# Patient Record
Sex: Female | Born: 1985 | Race: Black or African American | Hispanic: No | Marital: Single | Smoking: Current every day smoker
Health system: Southern US, Community
[De-identification: ages and names within clinical notes are randomized; demographics above are authoritative.]

## PROBLEM LIST (undated history)

## (undated) ENCOUNTER — Emergency Department (HOSPITAL_COMMUNITY): Admission: EM | Disposition: A | Discharge status: Home / Self Care | Payer: Self-pay

---

## 2008-06-28 ENCOUNTER — Inpatient Hospital Stay (HOSPITAL_COMMUNITY): Admission: AD | Admit: 2008-06-28 | Discharge: 2008-06-28 | Payer: Self-pay | Admitting: Obstetrics & Gynecology

## 2008-07-07 ENCOUNTER — Inpatient Hospital Stay (HOSPITAL_COMMUNITY): Admission: AD | Admit: 2008-07-07 | Discharge: 2008-07-08 | Payer: Self-pay | Admitting: Obstetrics & Gynecology

## 2008-08-26 ENCOUNTER — Inpatient Hospital Stay (HOSPITAL_COMMUNITY): Admission: AD | Admit: 2008-08-26 | Discharge: 2008-08-26 | Payer: Self-pay | Admitting: Obstetrics

## 2008-09-01 ENCOUNTER — Ambulatory Visit (HOSPITAL_COMMUNITY): Admission: RE | Admit: 2008-09-01 | Discharge: 2008-09-01 | Payer: Self-pay | Admitting: Obstetrics

## 2008-10-16 ENCOUNTER — Inpatient Hospital Stay (HOSPITAL_COMMUNITY): Admission: AD | Admit: 2008-10-16 | Discharge: 2008-10-16 | Payer: Self-pay | Admitting: Obstetrics

## 2008-12-21 ENCOUNTER — Inpatient Hospital Stay (HOSPITAL_COMMUNITY): Admission: AD | Admit: 2008-12-21 | Discharge: 2008-12-26 | Payer: Self-pay | Admitting: Obstetrics

## 2008-12-28 ENCOUNTER — Inpatient Hospital Stay (HOSPITAL_COMMUNITY): Admission: AD | Admit: 2008-12-28 | Discharge: 2008-12-28 | Payer: Self-pay | Admitting: Obstetrics

## 2011-02-19 LAB — CBC
HCT: 24 % — ABNORMAL LOW (ref 36.0–46.0)
HCT: 30.1 % — ABNORMAL LOW (ref 36.0–46.0)
Hemoglobin: 10.1 g/dL — ABNORMAL LOW (ref 12.0–15.0)
Hemoglobin: 10.4 g/dL — ABNORMAL LOW (ref 12.0–15.0)
Hemoglobin: 8 g/dL — ABNORMAL LOW (ref 12.0–15.0)
MCHC: 33.5 g/dL (ref 30.0–36.0)
MCHC: 34.3 g/dL (ref 30.0–36.0)
MCV: 87.9 fL (ref 78.0–100.0)
MCV: 89.1 fL (ref 78.0–100.0)
MCV: 90 fL (ref 78.0–100.0)
Platelets: 259 10*3/uL (ref 150–400)
Platelets: 270 10*3/uL (ref 150–400)
Platelets: 292 10*3/uL (ref 150–400)
RBC: 2.66 MIL/uL — ABNORMAL LOW (ref 3.87–5.11)
RBC: 3.35 MIL/uL — ABNORMAL LOW (ref 3.87–5.11)
RBC: 3.43 MIL/uL — ABNORMAL LOW (ref 3.87–5.11)
RBC: 3.67 MIL/uL — ABNORMAL LOW (ref 3.87–5.11)
WBC: 11 10*3/uL — ABNORMAL HIGH (ref 4.0–10.5)
WBC: 16.5 10*3/uL — ABNORMAL HIGH (ref 4.0–10.5)
WBC: 8.9 10*3/uL (ref 4.0–10.5)
WBC: 9.5 10*3/uL (ref 4.0–10.5)

## 2011-02-19 LAB — COMPREHENSIVE METABOLIC PANEL
ALT: 112 U/L — ABNORMAL HIGH (ref 0–35)
ALT: 193 U/L — ABNORMAL HIGH (ref 0–35)
ALT: 203 U/L — ABNORMAL HIGH (ref 0–35)
ALT: 87 U/L — ABNORMAL HIGH (ref 0–35)
AST: 134 U/L — ABNORMAL HIGH (ref 0–37)
AST: 169 U/L — ABNORMAL HIGH (ref 0–37)
AST: 58 U/L — ABNORMAL HIGH (ref 0–37)
Albumin: 2.1 g/dL — ABNORMAL LOW (ref 3.5–5.2)
Alkaline Phosphatase: 178 U/L — ABNORMAL HIGH (ref 39–117)
Alkaline Phosphatase: 242 U/L — ABNORMAL HIGH (ref 39–117)
Alkaline Phosphatase: 260 U/L — ABNORMAL HIGH (ref 39–117)
Alkaline Phosphatase: 277 U/L — ABNORMAL HIGH (ref 39–117)
BUN: 2 mg/dL — ABNORMAL LOW (ref 6–23)
BUN: <1 mg/dL — ABNORMAL LOW (ref 6–23)
CO2: 25 mEq/L (ref 19–32)
CO2: 27 mEq/L (ref 19–32)
Calcium: 7 mg/dL — ABNORMAL LOW (ref 8.4–10.5)
Calcium: 7.5 mg/dL — ABNORMAL LOW (ref 8.4–10.5)
Calcium: 8.5 mg/dL (ref 8.4–10.5)
Chloride: 101 mEq/L (ref 96–112)
Chloride: 103 mEq/L (ref 96–112)
Chloride: 106 mEq/L (ref 96–112)
GFR calc Af Amer: >60 mL/min (ref >60)
GFR calc Af Amer: >60 mL/min (ref >60)
GFR calc Af Amer: >60 mL/min (ref >60)
GFR calc non Af Amer: >60 mL/min (ref >60)
GFR calc non Af Amer: >60 mL/min (ref >60)
Glucose, Bld: 82 mg/dL (ref 70–99)
Glucose, Bld: 86 mg/dL (ref 70–99)
Glucose, Bld: 86 mg/dL (ref 70–99)
Potassium: 2.9 mEq/L — ABNORMAL LOW (ref 3.5–5.1)
Potassium: 3.6 mEq/L (ref 3.5–5.1)
Potassium: 4 mEq/L (ref 3.5–5.1)
Sodium: 135 mEq/L (ref 135–145)
Sodium: 137 mEq/L (ref 135–145)
Total Bilirubin: 0.3 mg/dL (ref 0.3–1.2)
Total Bilirubin: 0.4 mg/dL (ref 0.3–1.2)
Total Protein: 6.5 g/dL (ref 6.0–8.3)
Total Protein: 6.8 g/dL (ref 6.0–8.3)

## 2011-02-19 LAB — URINALYSIS, ROUTINE W REFLEX MICROSCOPIC
Bilirubin Urine: NEGATIVE
Glucose, UA: NEGATIVE mg/dL
Ketones, ur: NEGATIVE mg/dL
Protein, ur: NEGATIVE mg/dL
Specific Gravity, Urine: 1.01 (ref 1.005–1.030)
Urobilinogen, UA: 0.2 mg/dL (ref 0.0–1.0)

## 2011-02-19 LAB — URIC ACID
Uric Acid, Serum: 4.8 mg/dL (ref 2.4–7.0)
Uric Acid, Serum: 5.2 mg/dL (ref 2.4–7.0)

## 2011-02-19 LAB — LACTATE DEHYDROGENASE: LDH: 272 U/L — ABNORMAL HIGH (ref 94–250)

## 2011-03-22 NOTE — Discharge Summary (Signed)
[name], [name]             ACCOUNT NO.:  [id number]   MEDICAL RECORD NO.:  [id number]          PATIENT TYPE:  INP   LOCATION:  9111                          FACILITY:  [facility]   PHYSICIAN:  Roseanna Rainbow, M.D.DATE OF BIRTH:  07-14-1986   DATE OF ADMISSION:  12/21/2008  DATE OF DISCHARGE:  12/26/2008                               DISCHARGE SUMMARY   CHIEF COMPLAINT:  The patient is a 25 year old gravida 1 with an  estimated date of confinement on January 13, 2009 with a nosebleed and  elevated blood pressures.   HISTORY OF PRESENT ILLNESS:  Please see the above.   PAST SURGICAL HISTORY:  She denies.   PAST MEDICAL HISTORY:  Urinary tract infection.   MEDICATIONS:  Prenatal vitamins.   ALLERGIES:  No known drug allergies.   SOCIAL HISTORY:  She is single.  She denies any tobacco, ethanol, or  drug use.   FAMILY HISTORY:  Hypertension and depression.   PHYSICAL EXAMINATION:  VITAL SIGNS:  Afebrile, blood pressure 160/109.  LUNGS:  Clear to auscultation bilaterally.  HEART:  Regular rate and rhythm.  ABDOMEN:  Gravid and nontender.  PELVIC:  The cervix is 2 cm dilated, 80% with vertex at -2 station.   LABORATORY DATA:  Hemoglobin 10, platelets 281,000, SGOT 148, SGPT 203,  and uric acid 5.2.   ASSESSMENT:  Intrauterine pregnancy at 36 plus weeks with severe  preeclampsia.   PLAN:  Admission, induction of labor.  Magnesium sulfate for seizure  prophylaxis.   HOSPITAL COURSE:  The patient was admitted.  She was started on  magnesium sulfate.  She was also started on p.o. labetalol to set a  control of the elevated blood pressures.  On December 22, 2008 at  approximately 0800, the membranes were artificially ruptured.  She was 1  cm dilated.  She progressed in labor to fully dilated.  On December 23, 2008 at 1100, she was delivered of a live, viable female vaginally.  The  Apgars were 7 and 9 respectively.  The weight was 5 pounds 15 ounces.  There were no  lacerations.  The placenta was delivered spontaneously and  intact.  The estimated blood loss was 400 mL.  There were no  complications.  On postpartum day #1 the hemoglobin was 8.  SGOT was 58  and SGPT was 112.  She had been continued on magnesium sulfate for  seizure prophylaxis.  This was subsequently discontinued on postpartum  day #2.  The p.o. labetalol had been continued and her blood pressures  remained in range.  She was discharged to home on postpartum day #3.   DISCHARGE DIAGNOSES:  1. Severe preeclampsia.  2. Anemia.   PROCEDURES:  1. Induction of labor.  2. Vaginal delivery.   CONDITION:  Stable.   DIET:  Regular.   ACTIVITY:  Progressive activity, pelvic rest.   MEDICATIONS:  Included  1. Labetalol.  2. Percocet.  3. Ibuprofen.  4. Prenatal vitamins.  5. Iron supplement was recommended.   DISPOSITION:  The patient was to follow up in the office in 2 days.  Roseanna Rainbow, M.D.  Electronically Signed     LAJ/MEDQ  D:  01/13/2009  T:  01/14/2009  Job:  [id number]

## 2011-08-07 LAB — CBC
MCHC: 34.6
MCV: 89.2
Platelets: 256

## 2011-08-07 LAB — DIFFERENTIAL
Basophils Absolute: 0
Monocytes Absolute: 0.2
Monocytes Relative: 4
Neutro Abs: 4.6
Neutrophils Relative %: 81 — ABNORMAL HIGH

## 2011-08-07 LAB — MONONUCLEOSIS SCREEN: Mono Screen: NEGATIVE

## 2013-12-27 ENCOUNTER — Emergency Department (INDEPENDENT_AMBULATORY_CARE_PROVIDER_SITE_OTHER)
Admission: EM | Admit: 2013-12-27 | Discharge: 2013-12-27 | Disposition: A | Discharge status: Home / Self Care | Payer: Self-pay | Source: Home / Self Care

## 2013-12-27 ENCOUNTER — Encounter (HOSPITAL_COMMUNITY): Payer: Self-pay | Admitting: Emergency Medicine

## 2013-12-27 DIAGNOSIS — K648 Other hemorrhoids: Secondary | ICD-10-CM

## 2013-12-27 LAB — POCT URINALYSIS DIP (DEVICE)
Glucose, UA: NEGATIVE mg/dL
Hgb urine dipstick: NEGATIVE
LEUKOCYTES UA: NEGATIVE
NITRITE: NEGATIVE
PH: 6.5 (ref 5.0–8.0)
PROTEIN: NEGATIVE mg/dL
Specific Gravity, Urine: 1.025 (ref 1.005–1.030)
Urobilinogen, UA: 1 mg/dL (ref 0.0–1.0)

## 2013-12-27 LAB — POCT PREGNANCY, URINE: Preg Test, Ur: NEGATIVE

## 2013-12-27 MED ORDER — LIDOCAINE (ANORECTAL) 5 % EX CREA: TOPICAL_CREAM | CUTANEOUS | Status: AC

## 2013-12-27 MED ORDER — HYDROCORTISONE ACE-PRAMOXINE 2.5-1 % RE CREA: 1.0000 "application " | TOPICAL_CREAM | Freq: Three times a day (TID) | RECTAL | Status: AC

## 2013-12-27 NOTE — ED Notes (Signed)
Discussed plan to use stool softener, increase fluids

## 2013-12-27 NOTE — ED Provider Notes (Signed)
CSN: [id number]     Arrival date & time 12/27/13  1135 History   First MD Initiated Contact with Patient 12/27/13 1308     Chief Complaint  Patient presents with  . Rectal Bleeding     (Consider location/radiation/quality/duration/timing/severity/associated sxs/prior Treatment) HPI Comments: [age] year old female presents with a chief complaint of hemorrhoids associated with rectal bleeding with BMs. Symptoms have occurred for approximately one week. She is complaining of burning and itching.  Patient is a 28 y.o. female presenting with hematochezia.  Rectal Bleeding Associated symptoms: no abdominal pain and no vomiting     History reviewed. No pertinent past medical history. History reviewed. No pertinent past surgical history. History reviewed. No pertinent family history. History  Substance Use Topics  . Smoking status: Current Every Day Smoker  . Smokeless tobacco: Not on file  . Alcohol Use: No   OB History   Grav Para Term Preterm Abortions TAB SAB Ect Mult Living                 Review of Systems  Constitutional: Negative.   Gastrointestinal: Positive for blood in stool, hematochezia and anal bleeding. Negative for nausea, vomiting, abdominal pain and abdominal distention.  Genitourinary: Negative.       Allergies  Review of patient's allergies indicates not on file.  Home Medications   Current Outpatient Rx  Name  Route  Sig  Dispense  Refill  . hydrocortisone-pramoxine [facility](ANALPRAM-HC) 2.5-1 % rectal cream   Rectal   Place 1 application rectally 3 (three) times daily.   30 g   0   . Lidocaine, Anorectal, 5 % CREA      Apply small amount to painful area q 4 h prn   30 g   0    BP 122/79  Pulse 85  Temp(Src) 98.7 F (37.1 C) (Oral)  Resp 16  SpO2 99% Physical Exam  Nursing note and vitals reviewed. Constitutional: She is oriented to person, place, and time. She appears well-developed and well-nourished. No distress.  Neck: Normal range of motion.  Neck supple.  Pulmonary/Chest: Effort normal. No respiratory distress.  Abdominal: Soft. She exhibits no distension. There is no tenderness.  Genitourinary: Guaiac negative stool.  External Hemorrhoidal tag. Tenderness to the posterior rim of the anus.DRE reveals pain and tenderness along an enlarged elongated hemorrhoid/blood vessel on the posterior wall of the distal rectum.  Musculoskeletal: She exhibits no edema and no tenderness.  Neurological: She is alert and oriented to person, place, and time. She exhibits normal muscle tone.  Skin: Skin is warm and dry.  Psychiatric: She has a normal mood and affect.    ED Course  Procedures (including critical care time) Labs Review Labs Reviewed  POCT URINALYSIS DIP (DEVICE) - Abnormal; Notable for the following:    Bilirubin Urine SMALL (*)    Ketones, ur TRACE (*)    All other components within normal limits  POCT PREGNANCY, URINE   Imaging Review No results found.    MDM   Final diagnoses:  Internal hemorrhoid, bleeding   Use analpram and lidocaine creams as dir Stool softeners tid Increase fiber in diet F/U with your PCP    Hayden Rasmussenavid Anis Degidio, NP 12/27/13 1328

## 2013-12-27 NOTE — ED Notes (Signed)
C/o pain and rectal bleeding x 7 days; concern for hemorrhoid

## 2013-12-27 NOTE — Discharge Instructions (Signed)
Hemorrhoids Hemorrhoids are swollen veins around the rectum or anus. There are two types of hemorrhoids:   Internal hemorrhoids. These occur in the veins just inside the rectum. They may poke through to the outside and become irritated and painful.  External hemorrhoids. These occur in the veins outside the anus and can be felt as a painful swelling or hard lump near the anus. CAUSES  Pregnancy.   Obesity.   Constipation or diarrhea.   Straining to have a bowel movement.   Sitting for long periods on the toilet.  Heavy lifting or other activity that caused you to strain.  Anal intercourse. SYMPTOMS   Pain.   Anal itching or irritation.   Rectal bleeding.   Fecal leakage.   Anal swelling.   One or more lumps around the anus.  DIAGNOSIS  Your caregiver may be able to diagnose hemorrhoids by visual examination. Other examinations or tests that may be performed include:   Examination of the rectal area with a gloved hand (digital rectal exam).   Examination of anal canal using a small tube (scope).   A blood test if you have lost a significant amount of blood.  A test to look inside the colon (sigmoidoscopy or colonoscopy). TREATMENT    Use stool softener 3 times a day.  Use creams as directed, warm moist heat or warm water baths.  Most hemorrhoids can be treated at home. However, if symptoms do not seem to be getting better or if you have a lot of rectal bleeding, your caregiver may perform a procedure to help make the hemorrhoids get smaller or remove them completely. Possible treatments include:   Placing a rubber band at the base of the hemorrhoid to cut off the circulation (rubber band ligation).   Injecting a chemical to shrink the hemorrhoid (sclerotherapy).   Using a tool to burn the hemorrhoid (infrared light therapy).   Surgically removing the hemorrhoid (hemorrhoidectomy).   Stapling the hemorrhoid to block blood flow to the tissue  (hemorrhoid stapling).  HOME CARE INSTRUCTIONS   Eat foods with fiber, such as whole grains, beans, nuts, fruits, and vegetables. Ask your doctor about taking products with added fiber in them (fibersupplements).  Increase fluid intake. Drink enough water and fluids to keep your urine clear or pale yellow.   Exercise regularly.   Go to the bathroom when you have the urge to have a bowel movement. Do not wait.   Avoid straining to have bowel movements.   Keep the anal area dry and clean. Use wet toilet paper or moist towelettes after a bowel movement.   Medicated creams and suppositories may be used or applied as directed.   Only take over-the-counter or prescription medicines as directed by your caregiver.   Take warm sitz baths for 15 20 minutes, 3 4 times a day to ease pain and discomfort.   Place ice packs on the hemorrhoids if they are tender and swollen. Using ice packs between sitz baths may be helpful.   Put ice in a plastic bag.   Place a towel between your skin and the bag.   Leave the ice on for 15 20 minutes, 3 4 times a day.   Do not use a donut-shaped pillow or sit on the toilet for long periods. This increases blood pooling and pain.  SEEK MEDICAL CARE IF:  You have increasing pain and swelling that is not controlled by treatment or medicine.  You have uncontrolled bleeding.  You have difficulty or  you are unable to have a bowel movement.  You have pain or inflammation outside the area of the hemorrhoids. MAKE SURE YOU:  Understand these instructions.  Will watch your condition.  Will get help right away if you are not doing well or get worse. Document Released: 10/18/2000 Document Revised: 10/07/2012 Document Reviewed: 08/25/2012 [facility] Patient Information 2014 [employer], [place].

## 2013-12-28 NOTE — ED Provider Notes (Signed)
Medical screening examination/treatment/procedure(s) were performed by non-physician practitioner and as supervising physician I was immediately available for consultation/collaboration.  Leslee Homeavid Britzy Graul, M.D.  Reuben Likesavid C Neilson Oehlert, MD 12/28/13 1257

## 2015-08-20 ENCOUNTER — Encounter (HOSPITAL_COMMUNITY): Payer: Self-pay | Admitting: *Deleted

## 2015-08-20 ENCOUNTER — Emergency Department (HOSPITAL_COMMUNITY)
Admission: EM | Admit: 2015-08-20 | Discharge: 2015-08-20 | Disposition: A | Discharge status: Home / Self Care | Payer: Self-pay | Attending: Emergency Medicine | Admitting: Emergency Medicine

## 2015-08-20 ENCOUNTER — Emergency Department (HOSPITAL_COMMUNITY): Payer: Self-pay

## 2015-08-20 DIAGNOSIS — S90121A Contusion of right lesser toe(s) without damage to nail, initial encounter: Secondary | ICD-10-CM | POA: Insufficient documentation

## 2015-08-20 DIAGNOSIS — Z720 Tobacco use: Secondary | ICD-10-CM | POA: Insufficient documentation

## 2015-08-20 DIAGNOSIS — Z79899 Other long term (current) drug therapy: Secondary | ICD-10-CM | POA: Insufficient documentation

## 2015-08-20 DIAGNOSIS — Y998 Other external cause status: Secondary | ICD-10-CM | POA: Insufficient documentation

## 2015-08-20 DIAGNOSIS — Y9289 Other specified places as the place of occurrence of the external cause: Secondary | ICD-10-CM | POA: Insufficient documentation

## 2015-08-20 DIAGNOSIS — W228XXA Striking against or struck by other objects, initial encounter: Secondary | ICD-10-CM | POA: Insufficient documentation

## 2015-08-20 DIAGNOSIS — Y9389 Activity, other specified: Secondary | ICD-10-CM | POA: Insufficient documentation

## 2015-08-20 MED ORDER — TRAMADOL HCL 50 MG PO TABS
50.0000 mg | ORAL_TABLET | Freq: Once | ORAL | Status: AC
  Administered 2015-08-20: 50 mg via ORAL
  Filled 2015-08-20: qty 1

## 2015-08-20 MED ORDER — TRAMADOL HCL 50 MG PO TABS: 50.0000 mg | ORAL_TABLET | Freq: Four times a day (QID) | ORAL | Status: AC | PRN

## 2015-08-20 MED ORDER — IBUPROFEN 600 MG PO TABS: 600.0000 mg | ORAL_TABLET | Freq: Four times a day (QID) | ORAL | Status: AC | PRN

## 2015-08-20 MED ORDER — IBUPROFEN 200 MG PO TABS
600.0000 mg | ORAL_TABLET | Freq: Once | ORAL | Status: AC
  Administered 2015-08-20: 600 mg via ORAL
  Filled 2015-08-20 (×2): qty 1

## 2015-08-20 NOTE — Discharge Instructions (Signed)
Keep foot elevated with intermittent ice on toe. Take pain medication as prescribed. Ambulate as tolerated.

## 2015-08-20 NOTE — ED Provider Notes (Signed)
CSN: [id number]     Arrival date & time 08/20/15  0125 History  By signing my name below, I, [name], attest that this documentation has been prepared under the direction and in the presence of Loren Racer, MD. Electronically Signed: Phillis Marks, ED Scribe. 08/20/2015. 3:49 AM.  Chief Complaint  Patient presents with  . Toe Injury   The history is provided by the patient. No language interpreter was used.   HPI Comments: [name] is a 29 y.o. female who presents to the Emergency Department complaining of non-radiating 4th right toe pain onset one day ago. Pt states that the pain and swelling to her toe began after hitting it on a bedpost. She denies any other injury or fall. She denies taking anything for her pain.   History reviewed. No pertinent past medical history. History reviewed. No pertinent past surgical history. No family history on file. Social History  Substance Use Topics  . Smoking status: Current Every Day Smoker  . Smokeless tobacco: None  . Alcohol Use: No   OB History    No data available     Review of Systems  Musculoskeletal: Positive for arthralgias.  Skin: Negative for rash and wound.  Neurological: Negative for weakness and numbness.  All other systems reviewed and are negative.     Allergies  Review of patient's allergies indicates no known allergies.  Home Medications   Prior to Admission medications   Medication Sig Start Date End Date Taking? Authorizing Provider  hydrocortisone-pramoxine [facility]) 2.5-1 % rectal cream Place 1 application rectally 3 (three) times daily. 12/27/13   Hayden Rasmussen, NP  ibuprofen (ADVIL,MOTRIN) 600 MG tablet Take 1 tablet (600 mg total) by mouth every 6 (six) hours as needed. 08/20/15   Loren Racer, MD  Lidocaine, Anorectal, 5 % CREA Apply small amount to painful area q 4 h prn 12/27/13   Hayden Rasmussen, NP  traMADol (ULTRAM) 50 MG tablet Take 1 tablet (50 mg total) by mouth every 6 (six) hours  as needed. 08/20/15   Loren Racer, MD   BP 134/84 mmHg  Pulse 120  Temp(Src) 97.9 F (36.6 C) (Oral)  Resp 18  Wt 96 lb (43.545 kg)  SpO2 100%  LMP 08/20/2015 Physical Exam  Constitutional: She is oriented to person, place, and time. She appears well-developed and well-nourished. No distress.  HENT:  Head: Normocephalic and atraumatic.  Eyes: EOM are normal. Pupils are equal, round, and reactive to light.  Neck: Normal range of motion. Neck supple.  Cardiovascular: Normal rate.   Pulmonary/Chest: Effort normal.  Abdominal: Soft.  Musculoskeletal: Normal range of motion. She exhibits tenderness. She exhibits no edema.  Tenderness to palpation over the fourth digit on the right foot. There is no obvious deformity or swelling. Distal cap refill is intact.  Neurological: She is alert and oriented to person, place, and time.  Sensation intact. Moving all extremities without deficit.  Skin: Skin is warm and dry. No rash noted. No erythema.  Psychiatric: She has a normal mood and affect. Her behavior is normal.  Nursing note and vitals reviewed.   ED Course  Procedures (including critical care time) DIAGNOSTIC STUDIES: Oxygen Saturation is 100% on RA, normal by my interpretation.    COORDINATION OF CARE: 3:49 AM-Discussed treatment plan which includes RICE techniques and pain medication with pt at bedside and pt agreed to plan.    Labs Review Labs Reviewed - No data to display  Imaging Review Dg Foot Complete Right  08/20/2015  CLINICAL DATA:  Stubbed fourth toe.  Pain. EXAM: RIGHT FOOT COMPLETE - 3+ VIEW COMPARISON:  None. FINDINGS: There is no evidence of fracture or dislocation. There is no evidence of arthropathy or other focal bone abnormality. Soft tissues are unremarkable. IMPRESSION: Negative. Electronically Signed   By: Elige KoHetal  Patel   On: 08/20/2015 02:34   I have personally reviewed and evaluated these images and lab results as part of my medical  decision-making.   EKG Interpretation None      MDM   Final diagnoses:  Toe contusion, right, initial encounter    I, Khizar Fiorella, personally performed the services described in this documentation. All medical record entries made by the scribe were at my direction and in my presence.  I have reviewed the chart and discharge instructions and agree that the record reflects my personal performance and is accurate and complete. [name].  08/20/2015. 5:47 AM.   Emily SauerX-ray without evidence of fracture. Advised ice and pain medication at home.   Loren Raceravid Cristel Rail, MD 08/20/15 [id number]

## 2015-08-20 NOTE — ED Notes (Signed)
The pt is c/o rt 4th toe pain after she struck it on the bed approx 10 hours ago.  Sl  Swelling and pain

## 2015-08-25 ENCOUNTER — Other Ambulatory Visit: Payer: Self-pay

## 2015-08-25 DIAGNOSIS — Z1231 Encounter for screening mammogram for malignant neoplasm of breast: Secondary | ICD-10-CM

## 2018-09-03 ENCOUNTER — Encounter (HOSPITAL_COMMUNITY): Payer: Self-pay | Admitting: Emergency Medicine

## 2018-09-03 ENCOUNTER — Emergency Department (HOSPITAL_COMMUNITY): Payer: Self-pay

## 2018-09-03 ENCOUNTER — Emergency Department (HOSPITAL_COMMUNITY)
Admission: EM | Admit: 2018-09-03 | Discharge: 2018-09-03 | Disposition: A | Discharge status: Home / Self Care | Payer: Self-pay | Attending: Emergency Medicine | Admitting: Emergency Medicine

## 2018-09-03 DIAGNOSIS — R51 Headache: Secondary | ICD-10-CM | POA: Insufficient documentation

## 2018-09-03 DIAGNOSIS — S0181XA Laceration without foreign body of other part of head, initial encounter: Secondary | ICD-10-CM | POA: Insufficient documentation

## 2018-09-03 DIAGNOSIS — S82832A Other fracture of upper and lower end of left fibula, initial encounter for closed fracture: Secondary | ICD-10-CM | POA: Insufficient documentation

## 2018-09-03 DIAGNOSIS — Y9389 Activity, other specified: Secondary | ICD-10-CM | POA: Insufficient documentation

## 2018-09-03 DIAGNOSIS — Y999 Unspecified external cause status: Secondary | ICD-10-CM | POA: Insufficient documentation

## 2018-09-03 DIAGNOSIS — Y92410 Unspecified street and highway as the place of occurrence of the external cause: Secondary | ICD-10-CM | POA: Insufficient documentation

## 2018-09-03 DIAGNOSIS — S0993XA Unspecified injury of face, initial encounter: Secondary | ICD-10-CM | POA: Insufficient documentation

## 2018-09-03 LAB — COMPREHENSIVE METABOLIC PANEL
ALK PHOS: 61 U/L (ref 38–126)
ALT: 24 U/L (ref 0–44)
ANION GAP: 6 (ref 5–15)
AST: 31 U/L (ref 15–41)
Albumin: 3.7 g/dL (ref 3.5–5.0)
BILIRUBIN TOTAL: 0.4 mg/dL (ref 0.3–1.2)
BUN: 7 mg/dL (ref 6–20)
CALCIUM: 8.8 mg/dL — AB (ref 8.9–10.3)
CO2: 24 mmol/L (ref 22–32)
Chloride: 108 mmol/L (ref 98–111)
Creatinine, Ser: 0.77 mg/dL (ref 0.44–1.00)
Glucose, Bld: 153 mg/dL — ABNORMAL HIGH (ref 70–99)
POTASSIUM: 3.6 mmol/L (ref 3.5–5.1)
Sodium: 138 mmol/L (ref 135–145)
TOTAL PROTEIN: 6 g/dL — AB (ref 6.5–8.1)

## 2018-09-03 LAB — CDS SEROLOGY

## 2018-09-03 LAB — PROTIME-INR
INR: 0.91
PROTHROMBIN TIME: 12.1 s (ref 11.4–15.2)

## 2018-09-03 LAB — SAMPLE TO BLOOD BANK

## 2018-09-03 LAB — I-STAT CHEM 8, ED
BUN: 8 mg/dL (ref 6–20)
CALCIUM ION: 1.12 mmol/L — AB (ref 1.15–1.40)
Chloride: 105 mmol/L (ref 98–111)
Creatinine, Ser: 0.7 mg/dL (ref 0.44–1.00)
Glucose, Bld: 152 mg/dL — ABNORMAL HIGH (ref 70–99)
HEMATOCRIT: 35 % — AB (ref 36.0–46.0)
Hemoglobin: 11.9 g/dL — ABNORMAL LOW (ref 12.0–15.0)
POTASSIUM: 3.7 mmol/L (ref 3.5–5.1)
Sodium: 140 mmol/L (ref 135–145)
TCO2: 25 mmol/L (ref 22–32)

## 2018-09-03 LAB — I-STAT CG4 LACTIC ACID, ED: Lactic Acid, Venous: 1.8 mmol/L (ref 0.5–1.9)

## 2018-09-03 LAB — ETHANOL

## 2018-09-03 MED ORDER — FENTANYL CITRATE (PF) 100 MCG/2ML IJ SOLN: 50.0000 ug | Freq: Once | INTRAMUSCULAR | Status: DC

## 2018-09-03 MED ORDER — OXYCODONE-ACETAMINOPHEN 5-325 MG PO TABS: 1.0000 | ORAL_TABLET | ORAL | 0 refills | Status: AC | PRN

## 2018-09-03 MED ORDER — FENTANYL CITRATE (PF) 100 MCG/2ML IJ SOLN
50.0000 ug | Freq: Once | INTRAMUSCULAR | Status: AC
  Administered 2018-09-03: 50 ug via INTRAVENOUS

## 2018-09-03 MED ORDER — FENTANYL CITRATE (PF) 100 MCG/2ML IJ SOLN
50.0000 ug | Freq: Once | INTRAMUSCULAR | Status: AC
  Administered 2018-09-03: 50 ug via INTRAVENOUS
  Filled 2018-09-03: qty 2

## 2018-09-03 MED ORDER — OXYCODONE-ACETAMINOPHEN 5-325 MG PO TABS
1.0000 | ORAL_TABLET | Freq: Once | ORAL | Status: AC
  Administered 2018-09-03: 1 via ORAL
  Filled 2018-09-03: qty 1

## 2018-09-03 MED ORDER — FENTANYL CITRATE (PF) 100 MCG/2ML IJ SOLN
INTRAMUSCULAR | Status: AC
  Filled 2018-09-03: qty 2

## 2018-09-03 NOTE — Progress Notes (Signed)
Orthopedic Tech Progress Note Patient Details:  [name] 1986-01-16 [id number]  Patient ID: [name], female   DOB: 01/20/1986, 32 y.o.   MRN: [id number]   [name] 09/03/2018, 5:34 PM Made level 2 trauma visit
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
## 2018-09-03 NOTE — Discharge Instructions (Signed)
Your work-up today showed evidence of a small laceration to your face which was stitched.  Please follow-up with a primary doctor for suture removal in the next 7 to 10 days.  Please follow-up with orthopedics for your fibula fracture that is nondisplaced.  Please use the boot and crutches for nonweightbearing status.  You may use the pain medicine to help with your discomfort.  Please follow-up with dentistry for the tooth injury.  Please avoid chewy and crunchy foods.  If any symptoms change or worsen, please return to the nearest emergency department.
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
## 2018-09-03 NOTE — ED Notes (Signed)
Patient transported to CT 

## 2018-09-03 NOTE — Progress Notes (Signed)
I responded to a Level 2 page from the ED. I remained in the ED while the medical team worked with the patient to provide spiritual support as needed or requested. No identified family members present.    09/03/18 1800  Clinical Encounter Type  Visited With Patient not available  Visit Type Spiritual support;ED  Referral From Nurse  Consult/Referral To Chaplain    Chaplain Dr Melvyn Novas

## 2018-09-08 ENCOUNTER — Encounter (HOSPITAL_COMMUNITY): Payer: Self-pay | Admitting: *Deleted
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
